# Patient Record
Sex: Male | Born: 2000
Health system: Southern US, Community
[De-identification: ages and names within clinical notes are randomized; demographics above are authoritative.]

---
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
## 2020-02-02 ENCOUNTER — Encounter (HOSPITAL_COMMUNITY): Payer: Self-pay

## 2020-02-02 ENCOUNTER — Encounter (HOSPITAL_COMMUNITY)
Admission: EM | Disposition: A | Discharge status: Home / Self Care | Payer: Self-pay | Source: Home / Self Care | Attending: Emergency Medicine

## 2020-02-02 ENCOUNTER — Other Ambulatory Visit: Payer: Self-pay

## 2020-02-02 ENCOUNTER — Observation Stay (HOSPITAL_COMMUNITY)
Admission: EM | Admit: 2020-02-02 | Discharge: 2020-02-03 | Disposition: A | Discharge status: Home / Self Care | Payer: Self-pay | Attending: Otolaryngology | Admitting: Otolaryngology

## 2020-02-02 ENCOUNTER — Emergency Department (HOSPITAL_COMMUNITY): Payer: Self-pay | Admitting: Certified Registered Nurse Anesthetist

## 2020-02-02 ENCOUNTER — Emergency Department (HOSPITAL_COMMUNITY): Payer: Self-pay

## 2020-02-02 DIAGNOSIS — Z23 Encounter for immunization: Secondary | ICD-10-CM | POA: Insufficient documentation

## 2020-02-02 DIAGNOSIS — Y9301 Activity, walking, marching and hiking: Secondary | ICD-10-CM | POA: Insufficient documentation

## 2020-02-02 DIAGNOSIS — S0266XA Fracture of symphysis of mandible, initial encounter for closed fracture: Principal | ICD-10-CM | POA: Insufficient documentation

## 2020-02-02 DIAGNOSIS — T07XXXA Unspecified multiple injuries, initial encounter: Secondary | ICD-10-CM

## 2020-02-02 DIAGNOSIS — S02609B Fracture of mandible, unspecified, initial encounter for open fracture: Secondary | ICD-10-CM

## 2020-02-02 DIAGNOSIS — Z20822 Contact with and (suspected) exposure to covid-19: Secondary | ICD-10-CM | POA: Insufficient documentation

## 2020-02-02 DIAGNOSIS — S02609A Fracture of mandible, unspecified, initial encounter for closed fracture: Secondary | ICD-10-CM | POA: Diagnosis present

## 2020-02-02 DIAGNOSIS — W1830XA Fall on same level, unspecified, initial encounter: Secondary | ICD-10-CM | POA: Insufficient documentation

## 2020-02-02 DIAGNOSIS — R55 Syncope and collapse: Secondary | ICD-10-CM | POA: Insufficient documentation

## 2020-02-02 DIAGNOSIS — S0181XA Laceration without foreign body of other part of head, initial encounter: Secondary | ICD-10-CM | POA: Insufficient documentation

## 2020-02-02 DIAGNOSIS — Z79899 Other long term (current) drug therapy: Secondary | ICD-10-CM | POA: Insufficient documentation

## 2020-02-02 DIAGNOSIS — S02612A Fracture of condylar process of left mandible, initial encounter for closed fracture: Secondary | ICD-10-CM | POA: Insufficient documentation

## 2020-02-02 HISTORY — PX: ORIF MANDIBULAR FRACTURE: SHX2127

## 2020-02-02 LAB — CBC
HCT: 45 % (ref 39.0–52.0)
Hemoglobin: 15.4 g/dL (ref 13.0–17.0)
MCH: 31.2 pg (ref 26.0–34.0)
MCHC: 34.2 g/dL (ref 30.0–36.0)
MCV: 91.3 fL (ref 80.0–100.0)
Platelets: 286 10*3/uL (ref 150–400)
RBC: 4.93 MIL/uL (ref 4.22–5.81)
RDW: 11.8 % (ref 11.5–15.5)
WBC: 11.8 10*3/uL — ABNORMAL HIGH (ref 4.0–10.5)
nRBC: 0 % (ref 0.0–0.2)

## 2020-02-02 LAB — BASIC METABOLIC PANEL
Anion gap: 11 (ref 5–15)
BUN: 12 mg/dL (ref 6–20)
CO2: 25 mmol/L (ref 22–32)
Calcium: 9.4 mg/dL (ref 8.9–10.3)
Chloride: 103 mmol/L (ref 98–111)
Creatinine, Ser: 1.1 mg/dL (ref 0.61–1.24)
GFR calc Af Amer: >60 mL/min (ref >60)
GFR calc non Af Amer: >60 mL/min (ref >60)
Glucose, Bld: 135 mg/dL — ABNORMAL HIGH (ref 70–99)
Potassium: 3.3 mmol/L — ABNORMAL LOW (ref 3.5–5.1)
Sodium: 139 mmol/L (ref 135–145)

## 2020-02-02 LAB — URINALYSIS, ROUTINE W REFLEX MICROSCOPIC
Bacteria, UA: NONE SEEN
Bilirubin Urine: NEGATIVE
Glucose, UA: NEGATIVE mg/dL
Hgb urine dipstick: NEGATIVE
Ketones, ur: NEGATIVE mg/dL
Leukocytes,Ua: NEGATIVE
Nitrite: NEGATIVE
Protein, ur: 30 mg/dL — AB
Specific Gravity, Urine: 1.027 (ref 1.005–1.030)
pH: 5 (ref 5.0–8.0)

## 2020-02-02 LAB — SARS CORONAVIRUS 2 BY RT PCR (HOSPITAL ORDER, PERFORMED IN ~~LOC~~ HOSPITAL LAB): SARS Coronavirus 2: NEGATIVE

## 2020-02-02 SURGERY — OPEN REDUCTION INTERNAL FIXATION (ORIF) MANDIBULAR FRACTURE
Anesthesia: General

## 2020-02-02 MED ORDER — CEFAZOLIN SODIUM-DEXTROSE 1-4 GM/50ML-% IV SOLN
1.0000 g | Freq: Once | INTRAVENOUS | Status: AC
  Administered 2020-02-02: 1 g via INTRAVENOUS
  Filled 2020-02-02: qty 50

## 2020-02-02 MED ORDER — HYDROCODONE-ACETAMINOPHEN 7.5-325 MG/15ML PO SOLN
10.0000 mL | ORAL | Status: DC | PRN
  Administered 2020-02-03: 10 mL via ORAL
  Filled 2020-02-02: qty 15

## 2020-02-02 MED ORDER — LIDOCAINE-EPINEPHRINE (PF) 2 %-1:200000 IJ SOLN
20.0000 mL | Freq: Once | INTRAMUSCULAR | Status: AC
  Administered 2020-02-02: 20 mL
  Filled 2020-02-02: qty 20

## 2020-02-02 MED ORDER — ROCURONIUM BROMIDE 10 MG/ML (PF) SYRINGE
PREFILLED_SYRINGE | INTRAVENOUS | Status: DC | PRN
  Administered 2020-02-02: 70 mg via INTRAVENOUS

## 2020-02-02 MED ORDER — PROPOFOL 10 MG/ML IV BOLUS
INTRAVENOUS | Status: DC | PRN
  Administered 2020-02-02: 140 mg via INTRAVENOUS

## 2020-02-02 MED ORDER — AMISULPRIDE (ANTIEMETIC) 5 MG/2ML IV SOLN
INTRAVENOUS | Status: AC
  Filled 2020-02-02: qty 4

## 2020-02-02 MED ORDER — LACTATED RINGERS IV SOLN: INTRAVENOUS | Status: DC

## 2020-02-02 MED ORDER — SODIUM CHLORIDE 0.9 % IV BOLUS
1000.0000 mL | Freq: Once | INTRAVENOUS | Status: AC
  Administered 2020-02-02: 1000 mL via INTRAVENOUS

## 2020-02-02 MED ORDER — MIDAZOLAM HCL 5 MG/5ML IJ SOLN
INTRAMUSCULAR | Status: DC | PRN
  Administered 2020-02-02: 2 mg via INTRAVENOUS

## 2020-02-02 MED ORDER — ACETAMINOPHEN 10 MG/ML IV SOLN
INTRAVENOUS | Status: AC
  Filled 2020-02-02: qty 100

## 2020-02-02 MED ORDER — CLINDAMYCIN PHOSPHATE 600 MG/50ML IV SOLN
600.0000 mg | Freq: Once | INTRAVENOUS | Status: AC
  Administered 2020-02-02: 600 mg via INTRAVENOUS
  Filled 2020-02-02: qty 50

## 2020-02-02 MED ORDER — ACETAMINOPHEN 160 MG/5ML PO SOLN: 1000.0000 mg | Freq: Once | ORAL | Status: DC | PRN

## 2020-02-02 MED ORDER — OXYCODONE HCL 5 MG PO TABS: 5.0000 mg | ORAL_TABLET | Freq: Once | ORAL | Status: DC | PRN

## 2020-02-02 MED ORDER — 0.9 % SODIUM CHLORIDE (POUR BTL) OPTIME
TOPICAL | Status: DC | PRN
Start: 2020-02-02 — End: 2020-02-02
  Administered 2020-02-02: 1000 mL

## 2020-02-02 MED ORDER — CLINDAMYCIN PALMITATE HCL 75 MG/5ML PO SOLR
300.0000 mg | Freq: Three times a day (TID) | ORAL | Status: DC
  Administered 2020-02-02 – 2020-02-03 (×2): 300 mg via ORAL
  Filled 2020-02-02 (×4): qty 20

## 2020-02-02 MED ORDER — MIDAZOLAM HCL 2 MG/2ML IJ SOLN
INTRAMUSCULAR | Status: AC
  Filled 2020-02-02: qty 2

## 2020-02-02 MED ORDER — SUGAMMADEX SODIUM 200 MG/2ML IV SOLN
INTRAVENOUS | Status: DC | PRN
  Administered 2020-02-02: 200 mg via INTRAVENOUS

## 2020-02-02 MED ORDER — FENTANYL CITRATE (PF) 100 MCG/2ML IJ SOLN
INTRAMUSCULAR | Status: AC
  Filled 2020-02-02: qty 2

## 2020-02-02 MED ORDER — ACETAMINOPHEN 10 MG/ML IV SOLN
1000.0000 mg | Freq: Once | INTRAVENOUS | Status: DC | PRN
  Administered 2020-02-02: 1000 mg via INTRAVENOUS

## 2020-02-02 MED ORDER — DEXAMETHASONE SODIUM PHOSPHATE 10 MG/ML IJ SOLN
INTRAMUSCULAR | Status: DC | PRN
  Administered 2020-02-02: 10 mg via INTRAVENOUS

## 2020-02-02 MED ORDER — IBUPROFEN 100 MG/5ML PO SUSP: 400.0000 mg | Freq: Four times a day (QID) | ORAL | Status: DC | PRN

## 2020-02-02 MED ORDER — OXYMETAZOLINE HCL 0.05 % NA SOLN
NASAL | Status: AC
  Filled 2020-02-02: qty 30

## 2020-02-02 MED ORDER — AMISULPRIDE (ANTIEMETIC) 5 MG/2ML IV SOLN
10.0000 mg | Freq: Once | INTRAVENOUS | Status: AC
  Administered 2020-02-02: 10 mg via INTRAVENOUS

## 2020-02-02 MED ORDER — FENTANYL CITRATE (PF) 250 MCG/5ML IJ SOLN
INTRAMUSCULAR | Status: DC | PRN
  Administered 2020-02-02 (×2): 50 ug via INTRAVENOUS
  Administered 2020-02-02: 100 ug via INTRAVENOUS
  Administered 2020-02-02: 50 ug via INTRAVENOUS

## 2020-02-02 MED ORDER — FENTANYL CITRATE (PF) 100 MCG/2ML IJ SOLN
25.0000 ug | INTRAMUSCULAR | Status: DC | PRN
  Administered 2020-02-02 (×2): 50 ug via INTRAVENOUS
  Administered 2020-02-02 (×2): 25 ug via INTRAVENOUS
  Administered 2020-02-02: 50 ug via INTRAVENOUS

## 2020-02-02 MED ORDER — OXYMETAZOLINE HCL 0.05 % NA SOLN
2.0000 | NASAL | Status: AC
  Administered 2020-02-02 (×3): 2 via NASAL
  Filled 2020-02-02: qty 30

## 2020-02-02 MED ORDER — SODIUM CHLORIDE 0.9% FLUSH: 3.0000 mL | Freq: Once | INTRAVENOUS | Status: DC

## 2020-02-02 MED ORDER — LIDOCAINE 2% (20 MG/ML) 5 ML SYRINGE
INTRAMUSCULAR | Status: DC | PRN
  Administered 2020-02-02: 60 mg via INTRAVENOUS

## 2020-02-02 MED ORDER — CHLORHEXIDINE GLUCONATE 0.12 % MT SOLN
OROMUCOSAL | Status: AC
  Administered 2020-02-02: 15 mL via OROMUCOSAL
  Filled 2020-02-02: qty 15

## 2020-02-02 MED ORDER — FENTANYL CITRATE (PF) 250 MCG/5ML IJ SOLN
INTRAMUSCULAR | Status: AC
  Filled 2020-02-02: qty 5

## 2020-02-02 MED ORDER — OXYCODONE HCL 5 MG/5ML PO SOLN: 5.0000 mg | Freq: Once | ORAL | Status: DC | PRN

## 2020-02-02 MED ORDER — MORPHINE SULFATE (PF) 4 MG/ML IV SOLN
4.0000 mg | Freq: Once | INTRAVENOUS | Status: AC
  Administered 2020-02-02: 4 mg via INTRAVENOUS
  Filled 2020-02-02: qty 1

## 2020-02-02 MED ORDER — ONDANSETRON HCL 4 MG/2ML IJ SOLN
INTRAMUSCULAR | Status: DC | PRN
  Administered 2020-02-02: 4 mg via INTRAVENOUS

## 2020-02-02 MED ORDER — CHLORHEXIDINE GLUCONATE 0.12 % MT SOLN: 15.0000 mL | Freq: Once | OROMUCOSAL | Status: AC

## 2020-02-02 MED ORDER — DEXTROSE-NACL 5-0.9 % IV SOLN: INTRAVENOUS | Status: DC

## 2020-02-02 MED ORDER — TETANUS-DIPHTH-ACELL PERTUSSIS 5-2.5-18.5 LF-MCG/0.5 IM SUSP
0.5000 mL | Freq: Once | INTRAMUSCULAR | Status: AC
  Administered 2020-02-02: 0.5 mL via INTRAMUSCULAR
  Filled 2020-02-02: qty 0.5

## 2020-02-02 MED ORDER — ASHWAGANDHA 500 MG PO CAPS: 500.0000 mg | ORAL_CAPSULE | Freq: Every day | ORAL | Status: DC

## 2020-02-02 MED ORDER — PROMETHAZINE HCL 25 MG PO TABS: 25.0000 mg | ORAL_TABLET | Freq: Four times a day (QID) | ORAL | Status: DC | PRN

## 2020-02-02 MED ORDER — ACETAMINOPHEN 500 MG PO TABS: 1000.0000 mg | ORAL_TABLET | Freq: Once | ORAL | Status: DC | PRN

## 2020-02-02 MED ORDER — PROMETHAZINE HCL 25 MG RE SUPP
25.0000 mg | Freq: Four times a day (QID) | RECTAL | Status: DC | PRN
  Filled 2020-02-02: qty 1

## 2020-02-02 MED ORDER — LIDOCAINE-EPINEPHRINE 1 %-1:100000 IJ SOLN
INTRAMUSCULAR | Status: AC
  Filled 2020-02-02: qty 1

## 2020-02-02 MED ORDER — DOUBLE ANTIBIOTIC 500-10000 UNIT/GM EX OINT
TOPICAL_OINTMENT | CUTANEOUS | Status: AC
  Filled 2020-02-02: qty 28.4

## 2020-02-02 SURGICAL SUPPLY — 37 items
BAR ARCH PREFORM MXLMNDB FX (Miscellaneous) ×2 IMPLANT
BAR FIX PREFORMED OMNIMAX (Miscellaneous) ×4 IMPLANT
BLADE SURG 15 STRL LF DISP TIS (BLADE) IMPLANT
BLADE SURG 15 STRL SS (BLADE)
CANISTER SUCT 3000ML PPV (MISCELLANEOUS) ×3 IMPLANT
CLEANER TIP ELECTROSURG 2X2 (MISCELLANEOUS) ×3 IMPLANT
COVER SURGICAL LIGHT HANDLE (MISCELLANEOUS) ×3 IMPLANT
COVER WAND RF STERILE (DRAPES) IMPLANT
DRAPE HALF SHEET 40X57 (DRAPES) IMPLANT
ELECT COATED BLADE 2.86 ST (ELECTRODE) ×3 IMPLANT
ELECT NDL TIP 2.8 STRL (NEEDLE) IMPLANT
ELECT NEEDLE TIP 2.8 STRL (NEEDLE) IMPLANT
ELECT REM PT RETURN 9FT ADLT (ELECTROSURGICAL) ×3
ELECTRODE REM PT RTRN 9FT ADLT (ELECTROSURGICAL) ×1 IMPLANT
GLOVE ECLIPSE 7.5 STRL STRAW (GLOVE) ×3 IMPLANT
GOWN STRL REUS W/ TWL LRG LVL3 (GOWN DISPOSABLE) ×2 IMPLANT
GOWN STRL REUS W/TWL LRG LVL3 (GOWN DISPOSABLE) ×6
KIT BASIN OR (CUSTOM PROCEDURE TRAY) ×3 IMPLANT
KIT TURNOVER KIT B (KITS) ×3 IMPLANT
NDL PRECISIONGLIDE 27X1.5 (NEEDLE) ×1 IMPLANT
NEEDLE PRECISIONGLIDE 27X1.5 (NEEDLE) ×3 IMPLANT
NS IRRIG 1000ML POUR BTL (IV SOLUTION) ×3 IMPLANT
PAD ARMBOARD 7.5X6 YLW CONV (MISCELLANEOUS) ×6 IMPLANT
PENCIL FOOT CONTROL (ELECTRODE) ×3 IMPLANT
SCISSORS WIRE ANG 4 3/4 DISP (INSTRUMENTS) ×3 IMPLANT
SCREW BONE 2X7 CROSS DRIVE (Screw) ×6 IMPLANT
SCREW BONE MANDIB SD 2X9 (Screw) ×6 IMPLANT
SUT CHROMIC 3 0 PS 2 (SUTURE) ×3 IMPLANT
SUT STEEL 0 (SUTURE)
SUT STEEL 0 18XMFL TIE 17 (SUTURE) IMPLANT
SUT STEEL 2 (SUTURE) IMPLANT
SUT STEEL 4 (SUTURE) IMPLANT
SUT VICRYL 4-0 PS2 18IN ABS (SUTURE) ×3 IMPLANT
TOWEL GREEN STERILE FF (TOWEL DISPOSABLE) ×3 IMPLANT
TRAY ENT MC OR (CUSTOM PROCEDURE TRAY) ×3 IMPLANT
WATER STERILE IRR 1000ML POUR (IV SOLUTION) ×3 IMPLANT
WIRE 24 GAUGE OMINIMAX MMF (WIRE) ×2 IMPLANT

## 2020-02-02 NOTE — ED Notes (Signed)
Mother has pt belongings. She is waiting in surgical waiting area.

## 2020-02-02 NOTE — ED Notes (Signed)
Suture cart at bedside 

## 2020-02-02 NOTE — Anesthesia Preprocedure Evaluation (Addendum)
Anesthesia Evaluation  Patient identified by MRN, date of birth, ID band Patient awake    Reviewed: Allergy & Precautions, NPO status , Patient's Chart, lab work & pertinent test results  History of Anesthesia Complications Negative for: history of anesthetic complications  Airway Mallampati: IV  TM Distance: >3 FB Neck ROM: Full  Mouth opening: Limited Mouth Opening  Dental  (+) Dental Advisory Given,    Pulmonary Recent URI ,    breath sounds clear to auscultation       Cardiovascular negative cardio ROS   Rhythm:Regular     Neuro/Psych negative neurological ROS  negative psych ROS   GI/Hepatic negative GI ROS, Neg liver ROS,   Endo/Other  negative endocrine ROS  Renal/GU negative Renal ROS     Musculoskeletal negative musculoskeletal ROS (+)   Abdominal   Peds  Hematology negative hematology ROS (+)   Anesthesia Other Findings   Reproductive/Obstetrics                            Anesthesia Physical Anesthesia Plan  ASA: I  Anesthesia Plan: General   Post-op Pain Management:    Induction: Intravenous  PONV Risk Score and Plan: 2 and Treatment may vary due to age or medical condition, Ondansetron and Dexamethasone  Airway Management Planned: Nasal ETT  Additional Equipment: None  Intra-op Plan:   Post-operative Plan: Extubation in OR  Informed Consent: I have reviewed the patients History and Physical, chart, labs and discussed the procedure including the risks, benefits and alternatives for the proposed anesthesia with the patient or authorized representative who has indicated his/her understanding and acceptance.     Dental advisory given  Plan Discussed with: CRNA and Surgeon  Anesthesia Plan Comments:         Anesthesia Quick Evaluation

## 2020-02-02 NOTE — ED Provider Notes (Signed)
[facility] EMERGENCY DEPARTMENT Provider Note   CSN: [id number] Arrival date & time: 02/02/20  0446     History Chief Complaint  Patient presents with  . Loss of Consciousness    Shahzaib Azevedo is a 19 y.o. male.  The history is provided by the patient. No language interpreter was used.  Loss of Consciousness    19 year old male without significant past medical history presenting for evaluation of syncope.  Patient states he works third shift at [employer].  He has been working there for approximately 1 year.  This morning after he got up from a shift, drove home, and while in the process of getting out of his car he report feeling lightheadedness and subsequently had a syncopal episode.  He fell forward striking the ground with his face and arm.  He believes he may have passed out for a few seconds.  He denies any confusion afterward.  He does complain of pain to his face and chin, along with pain to his chest and hands from the impact.  He denies any tongue biting, urinary or bowel incontinence.  He denies any focal numbness weakness denies any significant neck pain.  Denies any vertiginous symptoms.  No history of seizure.  He did not think it was hot at his workplace.  He denies missing any meal.  He does not have any known history of cardiac disease.  No prior history of PE or DVT.  No complaints of shortness of breath.  History reviewed. No pertinent past medical history.  There are no problems to display for this patient.   History reviewed. No pertinent surgical history.     No family history on file.  Social History   Tobacco Use  . Smoking status: Never Smoker  . Smokeless tobacco: Never Used  Substance Use Topics  . Alcohol use: Never  . Drug use: Never    Home Medications Prior to Admission medications   Medication Sig Start Date End Date Taking? Authorizing Provider  lansoprazole (PREVACID) 15 MG capsule Take 1 capsule (15 mg  total) by mouth daily. 10/27/12   Niel Hummer, MD    Allergies    Patient has no known allergies.  Review of Systems   Review of Systems  Cardiovascular: Positive for syncope.  All other systems reviewed and are negative.   Physical Exam Updated Vital Signs BP 122/64 (BP Location: Left Arm)   Pulse 81   Temp 98.7 F (37.1 C) (Oral)   Resp 16   Ht 5\' 3"  (1.6 m)   Wt 55.8 kg   SpO2 100%   BMI 21.79 kg/m   Physical Exam Vitals and nursing note reviewed.  Constitutional:      General: He is not in acute distress.    Appearance: He is well-developed.  HENT:     Head:     Comments: Abrasion noted to right cheek.  3 cm laceration noted to the inferior chin on the right side with tenderness to palpation.  No malocclusion noted.  No hemotympanum, no septal hematoma, no significant midface tenderness. Eyes:     Extraocular Movements: Extraocular movements intact.     Conjunctiva/sclera: Conjunctivae normal.     Pupils: Pupils are equal, round, and reactive to light.  Neck:     Comments: No cervical midline spine tenderness. Cardiovascular:     Rate and Rhythm: Normal rate and regular rhythm.     Pulses: Normal pulses.     Heart sounds: Normal heart sounds.  Pulmonary:     Effort: Pulmonary effort is normal.     Breath sounds: Normal breath sounds.  Chest:     Chest wall: Tenderness (Mild tenderness to right anterior chest wall without any crepitus or emphysema.  No overlying skin changes.) present.  Abdominal:     General: Abdomen is flat.     Palpations: Abdomen is soft.  Musculoskeletal:     Cervical back: Normal range of motion and neck supple.  Skin:    Findings: No rash.     Comments: Abrasion noted to the dorsum of hands bilaterally with normal movement about all fingers.  Neurological:     Mental Status: He is alert and oriented to person, place, and time.     GCS: GCS eye subscore is 4. GCS verbal subscore is 5. GCS motor subscore is 6.     Cranial Nerves:  Cranial nerves are intact.     Sensory: Sensation is intact.     Motor: Motor function is intact.     Gait: Gait is intact.     ED Results / Procedures / Treatments   Labs (all labs ordered are listed, but only abnormal results are displayed) Labs Reviewed  BASIC METABOLIC PANEL - Abnormal; Notable for the following components:      Result Value   Potassium 3.3 (*)    Glucose, Bld 135 (*)    All other components within normal limits  CBC - Abnormal; Notable for the following components:   WBC 11.8 (*)    All other components within normal limits  URINALYSIS, ROUTINE W REFLEX MICROSCOPIC - Abnormal; Notable for the following components:   Protein, ur 30 (*)    All other components within normal limits  SARS CORONAVIRUS 2 BY RT PCR (HOSPITAL ORDER, PERFORMED IN [facility])  CBG MONITORING, ED    EKG EKG Interpretation  Date/Time:  Tuesday February 02 2020 05:11:17 EDT Ventricular Rate:  90 PR Interval:  146 QRS Duration: 88 QT Interval:  356 QTC Calculation: 435 R Axis:   93 Text Interpretation: Normal sinus rhythm Right atrial enlargement Rightward axis ST elevation, consider early repolarization Borderline ECG Confirmed by Virgina Norfolk [phone]) on 02/02/2020 1:00:36 PM   ED ECG REPORT   Date: 02/02/2020  Rate: 90  Rhythm: normal sinus rhythm  QRS Axis: right  Intervals: normal  ST/T Wave abnormalities: early repolarization  Conduction Disutrbances:none  Narrative Interpretation:   Old EKG Reviewed: none available  I have personally reviewed the EKG tracing and agree with the computerized printout as noted.   Radiology CT Head Wo Contrast  Result Date: 02/02/2020 CLINICAL DATA:  Recent fall with facial pain and headaches, initial encounter EXAM: CT HEAD WITHOUT CONTRAST CT MAXILLOFACIAL WITHOUT CONTRAST TECHNIQUE: Multidetector CT imaging of the head and maxillofacial structures were performed using the standard protocol without intravenous contrast.  Multiplanar CT image reconstructions of the maxillofacial structures were also generated. COMPARISON:  None. FINDINGS: CT HEAD FINDINGS Brain: No evidence of acute infarction, hemorrhage, hydrocephalus, extra-axial collection or mass lesion/mass effect. Vascular: No hyperdense vessel or unexpected calcification. Skull: Normal. Negative for fracture or focal lesion. Other: Note is made of a fracture through the left mandibular neck extending towards the condyle. CT MAXILLOFACIAL FINDINGS Osseous: Multiple mandibular fractures are identified. A fracture through the left mandibular neck extending towards the condyle with mild impaction is noted. Additionally a midline fracture of the mandible anteriorly is seen extending obliquely through the right mandible. Only minimal displacement is noted in  the more anterior mandibular fracture. No other fractures are noted. Nasal bones are intact. Orbits: Orbits and their contents are within normal limits. Sinuses: Paranasal sinuses well aerated. Mucosal changes are seen. No air-fluid levels noted. Soft tissues: Surrounding soft tissue structures are within normal limits. No focal hematoma is seen. IMPRESSION: CT of the head: No acute intracranial abnormality noted. CT of the maxillofacial bones: Oblique fracture through the anterior aspect of the mandible extending to the right as well as an oblique fracture through the mandibular neck on the left with impaction. Electronically Signed   By: Alcide Clever M.D.   On: 02/02/2020 13:57   CT Maxillofacial Wo Contrast  Result Date: 02/02/2020 CLINICAL DATA:  Recent fall with facial pain and headaches, initial encounter EXAM: CT HEAD WITHOUT CONTRAST CT MAXILLOFACIAL WITHOUT CONTRAST TECHNIQUE: Multidetector CT imaging of the head and maxillofacial structures were performed using the standard protocol without intravenous contrast. Multiplanar CT image reconstructions of the maxillofacial structures were also generated. COMPARISON:   None. FINDINGS: CT HEAD FINDINGS Brain: No evidence of acute infarction, hemorrhage, hydrocephalus, extra-axial collection or mass lesion/mass effect. Vascular: No hyperdense vessel or unexpected calcification. Skull: Normal. Negative for fracture or focal lesion. Other: Note is made of a fracture through the left mandibular neck extending towards the condyle. CT MAXILLOFACIAL FINDINGS Osseous: Multiple mandibular fractures are identified. A fracture through the left mandibular neck extending towards the condyle with mild impaction is noted. Additionally a midline fracture of the mandible anteriorly is seen extending obliquely through the right mandible. Only minimal displacement is noted in the more anterior mandibular fracture. No other fractures are noted. Nasal bones are intact. Orbits: Orbits and their contents are within normal limits. Sinuses: Paranasal sinuses well aerated. Mucosal changes are seen. No air-fluid levels noted. Soft tissues: Surrounding soft tissue structures are within normal limits. No focal hematoma is seen. IMPRESSION: CT of the head: No acute intracranial abnormality noted. CT of the maxillofacial bones: Oblique fracture through the anterior aspect of the mandible extending to the right as well as an oblique fracture through the mandibular neck on the left with impaction. Electronically Signed   By: Alcide Clever M.D.   On: 02/02/2020 13:57    Procedures .Marland KitchenLaceration Repair  Date/Time: 02/02/2020 1:32 PM Performed by: Fayrene Helper, PA-C Authorized by: Fayrene Helper, PA-C   Consent:    Consent obtained:  Verbal   Consent given by:  Patient   Risks discussed:  Infection, need for additional repair, pain, poor cosmetic result and poor wound healing   Alternatives discussed:  No treatment and delayed treatment Universal protocol:    Procedure explained and questions answered to patient or proxy's satisfaction: yes     Relevant documents present and verified: yes     Test results  available and properly labeled: yes     Imaging studies available: yes     Required blood products, implants, devices, and special equipment available: yes     Site/side marked: yes     Immediately prior to procedure, a time out was called: yes     Patient identity confirmed:  Verbally with patient Anesthesia (see MAR for exact dosages):    Anesthesia method:  Local infiltration Laceration details:    Location:  Face   Face location:  Chin   Length (cm):  3   Depth (mm):  3 Repair type:    Repair type:  Simple Pre-procedure details:    Preparation:  Patient was prepped and draped in usual sterile fashion  Exploration:    Hemostasis achieved with:  Direct pressure   Wound exploration: wound explored through full range of motion and entire depth of wound probed and visualized     Wound extent: no foreign bodies/material noted and no underlying fracture noted     Contaminated: no   Treatment:    Area cleansed with:  Betadine and saline   Amount of cleaning:  Standard   Irrigation solution:  Sterile saline Skin repair:    Repair method:  Sutures   Suture size:  5-0   Suture material:  Prolene   Suture technique:  Simple interrupted   Number of sutures:  3 Approximation:    Approximation:  Close Post-procedure details:    Dressing:  Non-adherent dressing   Patient tolerance of procedure:  Tolerated well, no immediate complications   (including critical care time)  Medications Ordered in ED Medications  sodium chloride flush (NS) 0.9 % injection 3 mL (has no administration in time range)  ceFAZolin (ANCEF) IVPB 1 g/50 mL premix (has no administration in time range)  Tdap (BOOSTRIX) injection 0.5 mL (0.5 mLs Intramuscular Given 02/02/20 1115)  lidocaine-EPINEPHrine (XYLOCAINE W/EPI) 2 %-1:200000 (PF) injection 20 mL (20 mLs Infiltration Given 02/02/20 1116)  sodium chloride 0.9 % bolus 1,000 mL (0 mLs Intravenous Stopped 02/02/20 1156)  morphine 4 MG/ML injection 4 mg (4 mg  Intravenous Given 02/02/20 1116)    ED Course  I have reviewed the triage vital signs and the nursing notes.  Pertinent labs & imaging results that were available during my care of the patient were reviewed by me and considered in my medical decision making (see chart for details).    The cardiac monitor revealed normal sinus rhythm as interpreted by me. The cardiac monitor was ordered secondary to the patient's history of syncope and to monitor the patient for dysrhythmia  MDM Rules/Calculators/A&P                          BP 122/64 (BP Location: Left Arm)   Pulse 81   Temp 98.7 F (37.1 C) (Oral)   Resp 16   Ht  (1.6 m)   Wt 55.8 kg   SpO2 100%   BMI 21.79 kg/m   Final Clinical Impression(s) / ED Diagnoses Final diagnoses:  Syncope and collapse  Jaw fracture, open, initial encounter (HCC)  Chin laceration, initial encounter  Abrasions of multiple sites    Rx / DC Orders ED Discharge Orders    None     10:57 AM Patient presents for evaluation of a syncopal event that happened when he got out from his car after a night shift.  He did endorse some lightheadedness prior to his syncope.  He has abrasion as well as laceration to his face at his cheek, abrasion also noted to dorsum of hands.  He is mentating appropriately, no significant cervical midline spine tenderness.  Will obtain head and lateral facial CT scan and will provide laceration repair.  Will check orthostatic vital sign, and give IV fluid.  Patient is EKG did show some ST elevation, consistent with early repolarization.  No concerning arrhythmia. No obvious ECG changes to suggest HOCM.   1:33 PM Chin laceration repaired by me.  Sutures will need to be removed in 5 days.  2:13 PM Head CT and maxillofacial CT obtained demonstrate oblique fracture through the anterior aspects of the mandible extending to the right as well as an oblique fracture through  the mandible neck on the left with impaction.  This is  considered to be an unstable jaw fracture.  Will consult maxillofacial/ENT specialist for recommendation.  2:50 PM Appreciate consultation from ENT specialist Dr. Pollyann Kennedyosen who request pt to be NPO and he will evaluate pt in the ER and likely OR for wire jaw. Since pt has laceration to chin and has jaw fracture, will initiate abx Ancef as potential open fx.  covid-19 screening test ordered.    Jyl Heinzdrian Trinidad-Patricio was evaluated in Emergency Department on 02/02/2020 for the symptoms described in the history of present illness. He was evaluated in the context of the global COVID-19 pandemic, which necessitated consideration that the patient might be at risk for infection with the SARS-CoV-2 virus that causes COVID-19. Institutional protocols and algorithms that pertain to the evaluation of patients at risk for COVID-19 are in a state of rapid change based on information released by regulatory bodies including the CDC and federal and state organizations. These policies and algorithms were followed during the patient's care in the ED.    Fayrene Helperran, Ophie Burrowes, PA-C 02/02/20 1453    Virgina Norfolkuratolo, Adam, DO 02/02/20 [phone]

## 2020-02-02 NOTE — ED Notes (Signed)
PA at bedside.

## 2020-02-02 NOTE — H&P (Signed)
David Ball is an 19 y.o. male.   Chief Complaint: mandible fracture HPI: Early this morning, he got lightheaded, fainted and fell on his chin, Evaluation in the ED revealed bialteral mandible fracture. He complains of pain in the right side anterior jaw and the left pre-auricular region. He complains that his teeth are not aligned properly. Otherwise in good health. Not a smoker or drinker.  History reviewed. No pertinent past medical history.  History reviewed. No pertinent surgical history.  No family history on file. Social History:  reports that he has never smoked. He has never used smokeless tobacco. He reports that he does not drink alcohol and does not use drugs.  Allergies: No Known Allergies  (Not in a hospital admission)   Results for orders placed or performed during the hospital encounter of 02/02/20 (from the past 48 hour(s))  Urinalysis, Routine w reflex microscopic     Status: Abnormal   Collection Time: 02/02/20  4:54 AM  Result Value Ref Range   Color, Urine YELLOW YELLOW   APPearance CLEAR CLEAR   Specific Gravity, Urine 1.027 1.005 - 1.030   pH 5.0 5.0 - 8.0   Glucose, UA NEGATIVE NEGATIVE mg/dL   Hgb urine dipstick NEGATIVE NEGATIVE   Bilirubin Urine NEGATIVE NEGATIVE   Ketones, ur NEGATIVE NEGATIVE mg/dL   Protein, ur 30 (A) NEGATIVE mg/dL   Nitrite NEGATIVE NEGATIVE   Leukocytes,Ua NEGATIVE NEGATIVE   RBC / HPF 0-5 0 - 5 RBC/hpf   WBC, UA 0-5 0 - 5 WBC/hpf   Bacteria, UA NONE SEEN NONE SEEN   Mucus PRESENT     Comment: Performed at [facility], [address] [place], [place] [address]  Basic metabolic panel     Status: Abnormal   Collection Time: 02/02/20  5:00 AM  Result Value Ref Range   Sodium 139 135 - 145 mmol/L   Potassium 3.3 (L) 3.5 - 5.1 mmol/L   Chloride 103 98 - 111 mmol/L   CO2 25 22 - 32 mmol/L   Glucose, Bld 135 (H) 70 - 99 mg/dL    Comment: Glucose reference range applies only to samples taken after fasting for  at least 8 hours.   BUN 12 6 - 20 mg/dL   Creatinine, Ser 9.50 0.61 - 1.24 mg/dL   Calcium 9.4 8.9 - 93.2 mg/dL   GFR calc non Af Amer >60 >60 mL/min   GFR calc Af Amer >60 >60 mL/min   Anion gap 11 5 - 15    Comment: Performed at [facility], [address] [place], [place] [address]  CBC     Status: Abnormal   Collection Time: 02/02/20  5:00 AM  Result Value Ref Range   WBC 11.8 (H) 4.0 - 10.5 K/uL   RBC 4.93 4.22 - 5.81 MIL/uL   Hemoglobin 15.4 13.0 - 17.0 g/dL   HCT 58.0 39 - 52 %   MCV 91.3 80.0 - 100.0 fL   MCH 31.2 26.0 - 34.0 pg   MCHC 34.2 30.0 - 36.0 g/dL   RDW 99.8 33.8 - 25.0 %   Platelets 286 150 - 400 K/uL   nRBC 0.0 0.0 - 0.2 %    Comment: Performed at [facility], [address] [place], [place] [address]   CT Head Wo Contrast  Result Date: 02/02/2020 CLINICAL DATA:  Recent fall with facial pain and headaches, initial encounter EXAM: CT HEAD WITHOUT CONTRAST CT MAXILLOFACIAL WITHOUT CONTRAST TECHNIQUE: Multidetector CT  imaging of the head and maxillofacial structures were performed using the standard protocol without intravenous contrast. Multiplanar CT image reconstructions of the maxillofacial structures were also generated. COMPARISON:  None. FINDINGS: CT HEAD FINDINGS Brain: No evidence of acute infarction, hemorrhage, hydrocephalus, extra-axial collection or mass lesion/mass effect. Vascular: No hyperdense vessel or unexpected calcification. Skull: Normal. Negative for fracture or focal lesion. Other: Note is made of a fracture through the left mandibular neck extending towards the condyle. CT MAXILLOFACIAL FINDINGS Osseous: Multiple mandibular fractures are identified. A fracture through the left mandibular neck extending towards the condyle with mild impaction is noted. Additionally a midline fracture of the mandible anteriorly is seen extending obliquely through the right mandible. Only minimal displacement is noted in the more anterior mandibular  fracture. No other fractures are noted. Nasal bones are intact. Orbits: Orbits and their contents are within normal limits. Sinuses: Paranasal sinuses well aerated. Mucosal changes are seen. No air-fluid levels noted. Soft tissues: Surrounding soft tissue structures are within normal limits. No focal hematoma is seen. IMPRESSION: CT of the head: No acute intracranial abnormality noted. CT of the maxillofacial bones: Oblique fracture through the anterior aspect of the mandible extending to the right as well as an oblique fracture through the mandibular neck on the left with impaction. Electronically Signed   By: Alcide Clever M.D.   On: 02/02/2020 13:57   CT Maxillofacial Wo Contrast  Result Date: 02/02/2020 CLINICAL DATA:  Recent fall with facial pain and headaches, initial encounter EXAM: CT HEAD WITHOUT CONTRAST CT MAXILLOFACIAL WITHOUT CONTRAST TECHNIQUE: Multidetector CT imaging of the head and maxillofacial structures were performed using the standard protocol without intravenous contrast. Multiplanar CT image reconstructions of the maxillofacial structures were also generated. COMPARISON:  None. FINDINGS: CT HEAD FINDINGS Brain: No evidence of acute infarction, hemorrhage, hydrocephalus, extra-axial collection or mass lesion/mass effect. Vascular: No hyperdense vessel or unexpected calcification. Skull: Normal. Negative for fracture or focal lesion. Other: Note is made of a fracture through the left mandibular neck extending towards the condyle. CT MAXILLOFACIAL FINDINGS Osseous: Multiple mandibular fractures are identified. A fracture through the left mandibular neck extending towards the condyle with mild impaction is noted. Additionally a midline fracture of the mandible anteriorly is seen extending obliquely through the right mandible. Only minimal displacement is noted in the more anterior mandibular fracture. No other fractures are noted. Nasal bones are intact. Orbits: Orbits and their contents are  within normal limits. Sinuses: Paranasal sinuses well aerated. Mucosal changes are seen. No air-fluid levels noted. Soft tissues: Surrounding soft tissue structures are within normal limits. No focal hematoma is seen. IMPRESSION: CT of the head: No acute intracranial abnormality noted. CT of the maxillofacial bones: Oblique fracture through the anterior aspect of the mandible extending to the right as well as an oblique fracture through the mandibular neck on the left with impaction. Electronically Signed   By: Alcide Clever M.D.   On: 02/02/2020 13:57    ROS: otherwise negative  Blood pressure 120/68, pulse 80, temperature 98.7 F (37.1 C), temperature source Oral, resp. rate 16, height 5\' 3"  (1.6 m), weight 55.8 kg, SpO2 100 %.  PHYSICAL EXAM: Overall appearance:  Healthy appearing, in no distress Head:  Normocephalic, atraumatic. Ears: External ears are healthy. Nose: External nose is healthy in appearance. Internal nasal exam free of any lesions or obstruction. Oral Cavity/pharynx:  There are no mucosal lesions or masses identified. He is tender along the right anterior mandible. He has a mild open bite deformity  anteriorly. Hypopharynx/Larynx: no signs of any mucosal lesions or masses identified. Vocal cords move normally. Neuro:  No identifiable neurologic deficits. No hypesthesia in the mandibular nerve distribution. Neck: No palpable neck masses.  Studies Reviewed: maxillo-facial CT    Assessment/Plan Right anterior parasymphiseal non-displaced fracture, and left condylar displaced fracture with mal-occlusion. Recommend maxillo-mandibular fixation with arch bars later today.He will be admitted to hospital now and will likely go home tomorrow. He understands and agrees and all questions were answered.  Serena Colonel 02/02/2020, 4:12 PM

## 2020-02-02 NOTE — Anesthesia Procedure Notes (Signed)
Procedure Name: Intubation Date/Time: 02/02/2020 6:49 PM Performed by: Adonis Housekeeper, CRNA Pre-anesthesia Checklist: Patient identified, Emergency Drugs available, Suction available and Patient being monitored Patient Re-evaluated:Patient Re-evaluated prior to induction Oxygen Delivery Method: Circle system utilized Preoxygenation: Pre-oxygenation with 100% oxygen Induction Type: IV induction Ventilation: Mask ventilation without difficulty Laryngoscope Size: Glidescope and 4 Grade View: Grade I Nasal Tubes: Right, Nasal prep performed and Nasal Rae Tube size: 7.0 mm Number of attempts: 2 Airway Equipment and Method: Video-laryngoscopy Placement Confirmation: ETT inserted through vocal cords under direct vision,  positive ETCO2 and breath sounds checked- equal and bilateral Tube secured with: Tape Dental Injury: Teeth and Oropharynx as per pre-operative assessment

## 2020-02-02 NOTE — Transfer of Care (Signed)
Immediate Anesthesia Transfer of Care Note  Patient: David Ball  Procedure(s) Performed: MMS FIXATION WITH HYBIRD ARCH BARS (N/A )  Patient Location: PACU  Anesthesia Type:General  Level of Consciousness: awake, alert , oriented and patient cooperative  Airway & Oxygen Therapy: Patient Spontanous Breathing  Post-op Assessment: Report given to RN and Post -op Vital signs reviewed and stable  Post vital signs: Reviewed and stable  Last Vitals:  Vitals Value Taken Time  BP 154/88 02/02/20 1951  Temp 36.5 C 02/02/20 1950  Pulse 87 02/02/20 1954  Resp 9 02/02/20 1954  SpO2 100 % 02/02/20 1954  Vitals shown include unvalidated device data.  Last Pain:  Vitals:   02/02/20 1950  TempSrc:   PainSc: 0-No pain         Complications: No complications documented.

## 2020-02-02 NOTE — Op Note (Signed)
OPERATIVE REPORT  DATE OF SURGERY: 02/02/2020  PATIENT:  David Ball,  19 y.o. male  PRE-OPERATIVE DIAGNOSIS:  mandible fracture  POST-OPERATIVE DIAGNOSIS:  mandible fracture  PROCEDURE:  Procedure(s): MMS FIXATION WITH HYBIRD ARCH BARS  SURGEON:  Susy Frizzle, MD  ASSISTANTS: None  ANESTHESIA:   General   EBL: 10 ml  DRAINS: None  LOCAL MEDICATIONS USED:  None  SPECIMEN:  none  COUNTS:  Correct  PROCEDURE DETAILS: The patient was taken to the operating room and placed on the operating table in the supine position. Following induction of general endotracheal anesthesia, using nasal intubation, the mouth was prepped and draped in a standard fashion.  A cheek retractor was used throughout the case.  The occlusion was established manually and Omni max hybrid arch bars were placed in the maxilla and the mandible.  Each with 3 screws, 7 mm in the maxilla and 9 mm in the mandible.  There was good stable attachment of the arch bars.  24-gauge wires were used one on each side laterally around the molars and one anteriorly encompassing all of the incisors.  Occlusion was established and MMF was stable.  The oral cavity was suctioned and rinsed with saline.  Patient was awakened extubated and transferred to recovery in stable condition.    PATIENT DISPOSITION:  To PACU, stable

## 2020-02-02 NOTE — ED Triage Notes (Signed)
Pt states that he was walking into the house and had a syncopal episode, pt has several abrasions to the face and 1/2 laceration to the chin, bleeding controlled, last tetanus unknown. C/o of jaw pain

## 2020-02-03 ENCOUNTER — Encounter (HOSPITAL_COMMUNITY): Payer: Self-pay | Admitting: Otolaryngology

## 2020-02-03 MED ORDER — HYDROCODONE-ACETAMINOPHEN 7.5-325 MG/15ML PO SOLN: 15.0000 mL | Freq: Four times a day (QID) | ORAL | 0 refills | Status: DC | PRN

## 2020-02-03 MED ORDER — PROMETHAZINE HCL 25 MG RE SUPP
25.0000 mg | Freq: Four times a day (QID) | RECTAL | 1 refills | Status: DC | PRN
Start: 2020-02-03 — End: 2020-04-01

## 2020-02-03 MED ORDER — CLINDAMYCIN HCL 300 MG PO CAPS
300.0000 mg | ORAL_CAPSULE | Freq: Three times a day (TID) | ORAL | 0 refills | Status: DC
Start: 2020-02-03 — End: 2020-04-01

## 2020-02-03 MED FILL — PROMETHAZINE HCL 25 MG SUPP: 25 | 3 days supply | Qty: 12 | Fill #0

## 2020-02-03 MED FILL — HYDROCOD-APAP 7.5-325/15ML: 7.5-325 | 4 days supply | Qty: 376 | Fill #0

## 2020-02-03 MED FILL — CLINDAMYCIN HCL 300 MG CAPS: 300 | 5 days supply | Qty: 15 | Fill #0

## 2020-02-03 NOTE — Plan of Care (Signed)
  Problem: Education: Goal: Knowledge of General Education information will improve Description: Including pain rating scale, medication(s)/side effects and non-pharmacologic comfort measures Outcome: Progressing   Problem: Health Behavior/Discharge Planning: Goal: Ability to manage health-related needs will improve Outcome: Progressing   Problem: Clinical Measurements: Goal: Ability to maintain clinical measurements within normal limits will improve Outcome: Progressing Goal: Will remain free from infection Outcome: Progressing Goal: Diagnostic test results will improve Outcome: Progressing Goal: Respiratory complications will improve Outcome: Progressing   Problem: Activity: Goal: Risk for activity intolerance will decrease Outcome: Progressing   Problem: Nutrition: Goal: Adequate nutrition will be maintained Outcome: Progressing   Problem: Pain Managment: Goal: General experience of comfort will improve Outcome: Progressing   Problem: Safety: Goal: Ability to remain free from injury will improve Outcome: Progressing   Problem: Clinical Measurements: Goal: Postoperative complications will be avoided or minimized Outcome: Progressing

## 2020-02-03 NOTE — Discharge Summary (Signed)
Physician Discharge Summary  Patient ID: David Ball MRN: [id number] DOB/AGE: [birth year] 19 y.o.  Admit date: 02/02/2020 Discharge date: 02/03/2020  Admission Diagnoses: Mandible fracture  Discharge Diagnoses:  Active Problems:   Mandible fracture [facility])   Discharged Condition: good  Hospital Course: No complications  Consults: none  Significant Diagnostic Studies: none  Treatments: surgery: Maxillomandibular fixation with arch bars  Discharge Exam: Blood pressure 117/81, pulse 71, temperature 97.8 F (36.6 C), temperature source Oral, resp. rate 18, height 5\' 3"  (1.6 m), weight 55.8 kg, SpO2 100 %. PHYSICAL EXAM: Awake and alert, breathing clearly.  Voice is normal.  MMF is intact with good stability.  No swelling.  Disposition: Discharge disposition: 01-Home or Self Care       Discharge Instructions    Diet full liquid   Complete by: As directed    Increase activity slowly   Complete by: As directed      Allergies as of 02/03/2020   No Known Allergies     Medication List    TAKE these medications   Ashwagandha 500 MG Caps Take 500 mg by mouth daily.   clindamycin 300 MG capsule Commonly known as: Cleocin Take 1 capsule (300 mg total) by mouth 3 (three) times daily.   HYDROcodone-acetaminophen 7.5-325 mg/15 ml solution Commonly known as: HYCET Take 15 mLs by mouth 4 (four) times daily as needed for moderate pain.   lansoprazole 15 MG capsule Commonly known as: Prevacid Take 1 capsule (15 mg total) by mouth daily.   promethazine 25 MG suppository Commonly known as: PHENERGAN Place 1 suppository (25 mg total) rectally every 6 (six) hours as needed for nausea or vomiting.       Follow-up Information    12-04-1978, MD. Schedule an appointment as soon as possible for a visit in 2 week(s).   Specialty: Otolaryngology Contact information: [address] [place] [phone]                Signed: [phone] 02/03/2020, 10:49 AM

## 2020-02-03 NOTE — Progress Notes (Signed)
Discharged patient to home, AVS given and explain with Mother at bedside. Medication received via [facility] pharmacy. Belongings returned accordingly.

## 2020-02-03 NOTE — Progress Notes (Signed)
Report received from [place], [place]. Patient arrived to unit from PACU. Patient A&Ox4. Wire cutters at patients bedside within reach. Suction set up at bedside. Patient complain of no pain. Call bell within reach.

## 2020-02-03 NOTE — Discharge Instructions (Signed)
Rinse mouth after eating or drinking anything.  Stay on a full liquid diet.  Try hard to take him as much protein and as many calories as you can.  Brush teeth as often as you can.  If you feel the need to vomit, you may cut the 3 wires there is 1 on the right one on the left and one in the front and remove them and then you may open your mouth.  Contact our office so that we can set you up for replacement.

## 2020-02-09 NOTE — Anesthesia Postprocedure Evaluation (Signed)
Anesthesia Post Note  Patient: David Ball  Procedure(s) Performed: MMS FIXATION WITH HYBIRD ARCH BARS (N/A )     Patient location during evaluation: PACU Anesthesia Type: General Level of consciousness: awake and alert Pain management: pain level controlled Vital Signs Assessment: post-procedure vital signs reviewed and stable Respiratory status: spontaneous breathing, nonlabored ventilation, respiratory function stable and patient connected to nasal cannula oxygen Cardiovascular status: blood pressure returned to baseline and stable Postop Assessment: no apparent nausea or vomiting Anesthetic complications: no   No complications documented.  Last Vitals:  Vitals:   02/03/20 0540 02/03/20 1005  BP: 116/76 117/81  Pulse: 64 71  Resp: 18 18  Temp: 36.9 C 36.6 C  SpO2: 99% 100%    Last Pain:  Vitals:   02/03/20 1005  TempSrc: Axillary  PainSc:                  Tinsley Lomas

## 2020-03-28 ENCOUNTER — Encounter (HOSPITAL_BASED_OUTPATIENT_CLINIC_OR_DEPARTMENT_OTHER): Payer: Self-pay | Admitting: Otolaryngology

## 2020-03-28 ENCOUNTER — Other Ambulatory Visit: Payer: Self-pay

## 2020-03-28 ENCOUNTER — Encounter (HOSPITAL_COMMUNITY): Payer: Self-pay | Admitting: Otolaryngology

## 2020-03-29 ENCOUNTER — Other Ambulatory Visit (HOSPITAL_COMMUNITY)
Admission: RE | Admit: 2020-03-29 | Discharge: 2020-03-29 | Disposition: A | Discharge status: Home / Self Care | Payer: HRSA Program | Source: Ambulatory Visit | Attending: Otolaryngology | Admitting: Otolaryngology

## 2020-03-29 DIAGNOSIS — Z01812 Encounter for preprocedural laboratory examination: Secondary | ICD-10-CM | POA: Diagnosis present

## 2020-03-29 DIAGNOSIS — Z20822 Contact with and (suspected) exposure to covid-19: Secondary | ICD-10-CM | POA: Insufficient documentation

## 2020-03-29 LAB — SARS CORONAVIRUS 2 (TAT 6-24 HRS): SARS Coronavirus 2: NEGATIVE

## 2020-03-30 NOTE — H&P (Signed)
David Ball is an 19 y.o. male.   Chief Complaint: Mandible fracture HPI: Mandible fracture treated with MMF, here for removal of MMF.  History reviewed. No pertinent past medical history.  Past Surgical History:  Procedure Laterality Date  . ORIF MANDIBULAR FRACTURE N/A 02/02/2020   Procedure: MMS FIXATION WITH HYBIRD ARCH BARS;  Surgeon: Serena Colonel, MD;  Location: [facility] OR;  Service: ENT;  Laterality: N/A;    History reviewed. No pertinent family history. Social History:  reports that he has never smoked. He has never used smokeless tobacco. He reports that he does not drink alcohol and does not use drugs.  Allergies: No Known Allergies  No medications prior to admission.    Results for orders placed or performed during the hospital encounter of 03/29/20 (from the past 48 hour(s))  SARS CORONAVIRUS 2 (TAT 6-24 HRS) Nasopharyngeal Nasopharyngeal Swab     Status: None   Collection Time: 03/29/20  2:37 PM   Specimen: Nasopharyngeal Swab  Result Value Ref Range   SARS Coronavirus 2 NEGATIVE NEGATIVE    Comment: (NOTE) SARS-CoV-2 target nucleic acids are NOT DETECTED.  The SARS-CoV-2 RNA is generally detectable in upper and lower respiratory specimens during the acute phase of infection. Negative results do not preclude SARS-CoV-2 infection, do not rule out co-infections with other pathogens, and should not be used as the sole basis for treatment or other patient management decisions. Negative results must be combined with clinical observations, patient history, and epidemiological information. The expected result is Negative.  Fact Sheet for Patients: HairSlick.no  Fact Sheet for Healthcare Providers: quierodirigir.com  This test is not yet approved or cleared by the [place] FDA and  has been authorized for detection and/or diagnosis of SARS-CoV-2 by FDA under an Emergency Use Authorization (EUA). This  EUA will remain  in effect (meaning this test can be used) for the duration of the COVID-19 declaration under Se ction 564(b)(1) of the Act, 21 U.S.C. section 360bbb-3(b)(1), unless the authorization is terminated or revoked sooner.  Performed at [facility], [address], [place], [place] [address]    No results found.  ROS: otherwise negative  There were no vitals taken for this visit.  PHYSICAL EXAM: Overall appearance:  Healthy appearing, in no distress Head:  Normocephalic, atraumatic. Ears: External ears look healthy. Nose: External nose is healthy in appearance. Internal nasal exam free of any lesions or obstruction. Oral Cavity/pharynx: Arch bars in place. Hypopharynx/Larynx: no signs of any mucosal lesions or masses identified. Vocal cords move normally. Neuro:  No identifiable neurologic deficits. Neck: No palpable neck masses.  Studies Reviewed: none    Assessment/Plan Remove MMF.  Serena Colonel 03/30/2020, 10:49 AM

## 2020-04-01 ENCOUNTER — Encounter (HOSPITAL_BASED_OUTPATIENT_CLINIC_OR_DEPARTMENT_OTHER): Payer: Self-pay | Admitting: Otolaryngology

## 2020-04-01 ENCOUNTER — Encounter (HOSPITAL_BASED_OUTPATIENT_CLINIC_OR_DEPARTMENT_OTHER)
Admission: RE | Disposition: A | Discharge status: Home / Self Care | Payer: Self-pay | Source: Home / Self Care | Attending: Otolaryngology

## 2020-04-01 ENCOUNTER — Ambulatory Visit (HOSPITAL_BASED_OUTPATIENT_CLINIC_OR_DEPARTMENT_OTHER): Payer: Medicaid Other | Admitting: Certified Registered"

## 2020-04-01 ENCOUNTER — Ambulatory Visit (HOSPITAL_BASED_OUTPATIENT_CLINIC_OR_DEPARTMENT_OTHER)
Admission: RE | Admit: 2020-04-01 | Discharge: 2020-04-01 | Disposition: A | Discharge status: Home / Self Care | Payer: Medicaid Other | Attending: Otolaryngology | Admitting: Otolaryngology

## 2020-04-01 ENCOUNTER — Other Ambulatory Visit: Payer: Self-pay

## 2020-04-01 DIAGNOSIS — Z472 Encounter for removal of internal fixation device: Secondary | ICD-10-CM | POA: Insufficient documentation

## 2020-04-01 HISTORY — PX: MANDIBULAR HARDWARE REMOVAL: SHX5205

## 2020-04-01 SURGERY — REMOVAL, HARDWARE, MANDIBLE
Anesthesia: General | Site: Mouth | Laterality: Bilateral

## 2020-04-01 MED ORDER — BACITRACIN ZINC 500 UNIT/GM EX OINT
TOPICAL_OINTMENT | CUTANEOUS | Status: AC
  Filled 2020-04-01: qty 0.9

## 2020-04-01 MED ORDER — LIDOCAINE 2% (20 MG/ML) 5 ML SYRINGE
INTRAMUSCULAR | Status: AC
  Filled 2020-04-01: qty 5

## 2020-04-01 MED ORDER — OXYCODONE HCL 5 MG/5ML PO SOLN: 5.0000 mg | Freq: Once | ORAL | Status: DC | PRN

## 2020-04-01 MED ORDER — LIDOCAINE-EPINEPHRINE 1 %-1:100000 IJ SOLN
INTRAMUSCULAR | Status: DC | PRN
  Administered 2020-04-01: 5 mL

## 2020-04-01 MED ORDER — ONDANSETRON HCL 4 MG/2ML IJ SOLN
INTRAMUSCULAR | Status: AC
  Filled 2020-04-01: qty 2

## 2020-04-01 MED ORDER — DEXMEDETOMIDINE (PRECEDEX) IN NS 20 MCG/5ML (4 MCG/ML) IV SYRINGE
PREFILLED_SYRINGE | INTRAVENOUS | Status: DC | PRN
  Administered 2020-04-01 (×2): 4 ug via INTRAVENOUS

## 2020-04-01 MED ORDER — MIDAZOLAM HCL 2 MG/2ML IJ SOLN
INTRAMUSCULAR | Status: AC
  Filled 2020-04-01: qty 2

## 2020-04-01 MED ORDER — FENTANYL CITRATE (PF) 100 MCG/2ML IJ SOLN
INTRAMUSCULAR | Status: DC | PRN
Start: 2020-04-01 — End: 2020-04-01
  Administered 2020-04-01 (×2): 25 ug via INTRAVENOUS

## 2020-04-01 MED ORDER — HYDROMORPHONE HCL 1 MG/ML IJ SOLN
INTRAMUSCULAR | Status: AC
  Filled 2020-04-01: qty 0.5

## 2020-04-01 MED ORDER — LACTATED RINGERS IV SOLN: INTRAVENOUS | Status: DC

## 2020-04-01 MED ORDER — PROPOFOL 500 MG/50ML IV EMUL
INTRAVENOUS | Status: AC
  Filled 2020-04-01: qty 50

## 2020-04-01 MED ORDER — OXYCODONE HCL 5 MG PO TABS: 5.0000 mg | ORAL_TABLET | Freq: Once | ORAL | Status: DC | PRN

## 2020-04-01 MED ORDER — PROMETHAZINE HCL 25 MG/ML IJ SOLN: 6.2500 mg | INTRAMUSCULAR | Status: DC | PRN

## 2020-04-01 MED ORDER — LIDOCAINE-EPINEPHRINE 1 %-1:100000 IJ SOLN
INTRAMUSCULAR | Status: AC
  Filled 2020-04-01: qty 1

## 2020-04-01 MED ORDER — ONDANSETRON HCL 4 MG/2ML IJ SOLN
INTRAMUSCULAR | Status: DC | PRN
  Administered 2020-04-01: 4 mg via INTRAVENOUS

## 2020-04-01 MED ORDER — BACITRACIN 500 UNIT/GM EX OINT
TOPICAL_OINTMENT | CUTANEOUS | Status: DC | PRN
  Administered 2020-04-01: 1 via TOPICAL

## 2020-04-01 MED ORDER — OXYMETAZOLINE HCL 0.05 % NA SOLN
NASAL | Status: AC
  Filled 2020-04-01: qty 30

## 2020-04-01 MED ORDER — PROPOFOL 500 MG/50ML IV EMUL
INTRAVENOUS | Status: DC | PRN
  Administered 2020-04-01: 20 ug/kg/min via INTRAVENOUS

## 2020-04-01 MED ORDER — MIDAZOLAM HCL 5 MG/5ML IJ SOLN
INTRAMUSCULAR | Status: DC | PRN
  Administered 2020-04-01 (×2): 1 mg via INTRAVENOUS

## 2020-04-01 MED ORDER — FENTANYL CITRATE (PF) 100 MCG/2ML IJ SOLN
INTRAMUSCULAR | Status: AC
  Filled 2020-04-01: qty 2

## 2020-04-01 MED ORDER — HYDROMORPHONE HCL 1 MG/ML IJ SOLN
0.2500 mg | INTRAMUSCULAR | Status: DC | PRN
  Administered 2020-04-01: 0.5 mg via INTRAVENOUS

## 2020-04-01 MED ORDER — MEPERIDINE HCL 25 MG/ML IJ SOLN: 6.2500 mg | INTRAMUSCULAR | Status: DC | PRN

## 2020-04-01 SURGICAL SUPPLY — 29 items
BLADE SURG 15 STRL LF DISP TIS (BLADE) ×1 IMPLANT
BLADE SURG 15 STRL SS (BLADE) ×3
CANISTER SUCT 1200ML W/VALVE (MISCELLANEOUS) ×3 IMPLANT
COVER MAYO STAND STRL (DRAPES) ×3 IMPLANT
COVER WAND RF STERILE (DRAPES) IMPLANT
DECANTER SPIKE VIAL GLASS SM (MISCELLANEOUS) ×3 IMPLANT
ELECT COATED BLADE 2.86 ST (ELECTRODE) IMPLANT
ELECT REM PT RETURN 9FT ADLT (ELECTROSURGICAL)
ELECTRODE REM PT RTRN 9FT ADLT (ELECTROSURGICAL) IMPLANT
GAUZE 4X4 16PLY RFD (DISPOSABLE) IMPLANT
GLOVE ECLIPSE 7.5 STRL STRAW (GLOVE) ×3 IMPLANT
GOWN STRL REUS W/ TWL LRG LVL3 (GOWN DISPOSABLE) ×1 IMPLANT
GOWN STRL REUS W/TWL LRG LVL3 (GOWN DISPOSABLE) ×3
MARKER SKIN DUAL TIP RULER LAB (MISCELLANEOUS) IMPLANT
NDL PRECISIONGLIDE 27X1.5 (NEEDLE) ×1 IMPLANT
NEEDLE PRECISIONGLIDE 27X1.5 (NEEDLE) ×3 IMPLANT
NS IRRIG 1000ML POUR BTL (IV SOLUTION) IMPLANT
PACK BASIN DAY SURGERY FS (CUSTOM PROCEDURE TRAY) ×3 IMPLANT
PENCIL FOOT CONTROL (ELECTRODE) IMPLANT
SCISSORS WIRE ANG 4 3/4 DISP (INSTRUMENTS) IMPLANT
SHEET MEDIUM DRAPE 40X70 STRL (DRAPES) ×3 IMPLANT
SUT CHROMIC 3 0 PS 2 (SUTURE) IMPLANT
SUT CHROMIC 4 0 PS 2 18 (SUTURE) IMPLANT
SYR CONTROL 10ML LL (SYRINGE) ×3 IMPLANT
TOWEL GREEN STERILE FF (TOWEL DISPOSABLE) ×6 IMPLANT
TRAY DSU PREP LF (CUSTOM PROCEDURE TRAY) IMPLANT
TUBE CONNECTING 20'X1/4 (TUBING) ×1
TUBE CONNECTING 20X1/4 (TUBING) ×2 IMPLANT
YANKAUER SUCT BULB TIP NO VENT (SUCTIONS) ×3 IMPLANT

## 2020-04-01 NOTE — Anesthesia Postprocedure Evaluation (Signed)
Anesthesia Post Note  Patient: David Ball  Procedure(s) Performed: MANDIBULAR HARDWARE REMOVAL (Bilateral Mouth)     Patient location during evaluation: PACU Anesthesia Type: General Level of consciousness: awake and alert Pain management: pain level controlled Vital Signs Assessment: post-procedure vital signs reviewed and stable Respiratory status: spontaneous breathing, nonlabored ventilation and respiratory function stable Cardiovascular status: blood pressure returned to baseline and stable Postop Assessment: no apparent nausea or vomiting Anesthetic complications: no   No complications documented.  Last Vitals:  Vitals:   04/01/20 0815 04/01/20 0824  BP:  133/86  Pulse: 79 72  Resp: 12 16  Temp:  36.9 C  SpO2: 99% 100%    Last Pain:  Vitals:   04/01/20 0815  TempSrc:   PainSc: 4                  Lowella Curb

## 2020-04-01 NOTE — Interval H&P Note (Signed)
History and Physical Interval Note:  04/01/2020 7:19 AM  David Ball  has presented today for surgery, with the diagnosis of Hardware removal from jaw fracture.  The various methods of treatment have been discussed with the patient and family. After consideration of risks, benefits and other options for treatment, the patient has consented to  Procedure(s): MANDIBULAR HARDWARE REMOVAL (Bilateral) as a surgical intervention.  The patient's history has been reviewed, patient examined, no change in status, stable for surgery.  I have reviewed the patient's chart and labs.  Questions were answered to the patient's satisfaction.     Serena Colonel

## 2020-04-01 NOTE — Transfer of Care (Signed)
Immediate Anesthesia Transfer of Care Note  Patient: David Ball  Procedure(s) Performed: MANDIBULAR HARDWARE REMOVAL (Bilateral Mouth)  Patient Location: PACU  Anesthesia Type:General  Level of Consciousness: awake, alert  and oriented  Airway & Oxygen Therapy: Patient Spontanous Breathing and Patient connected to nasal cannula oxygen  Post-op Assessment: Report given to RN and Post -op Vital signs reviewed and stable  Post vital signs: Reviewed and stable  Last Vitals:  Vitals Value Taken Time  BP    Temp    Pulse 88 04/01/20 0753  Resp 15 04/01/20 0753  SpO2 100 % 04/01/20 0753  Vitals shown include unvalidated device data.  Last Pain:  Vitals:   04/01/20 0654  TempSrc: Axillary  PainSc: 0-No pain         Complications: No complications documented.

## 2020-04-01 NOTE — Anesthesia Preprocedure Evaluation (Addendum)
Anesthesia Evaluation  Patient identified by MRN, date of birth, ID band Patient awake    Reviewed: Allergy & Precautions, NPO status , Patient's Chart, lab work & pertinent test results  History of Anesthesia Complications Negative for: history of anesthetic complications  Airway Mallampati: IV  TM Distance: >3 FB Neck ROM: Full  Mouth opening: Limited Mouth Opening  Dental  (+) Dental Advisory Given,    Pulmonary    breath sounds clear to auscultation       Cardiovascular negative cardio ROS   Rhythm:Regular     Neuro/Psych negative neurological ROS  negative psych ROS   GI/Hepatic negative GI ROS, Neg liver ROS,   Endo/Other  negative endocrine ROS  Renal/GU negative Renal ROS     Musculoskeletal negative musculoskeletal ROS (+)   Abdominal   Peds  Hematology negative hematology ROS (+)   Anesthesia Other Findings   Reproductive/Obstetrics                            Anesthesia Physical  Anesthesia Plan  ASA: I  Anesthesia Plan: General   Post-op Pain Management:    Induction: Intravenous  PONV Risk Score and Plan: 2 and Treatment may vary due to age or medical condition, Ondansetron and Midazolam  Airway Management Planned: Mask  Additional Equipment: None  Intra-op Plan:   Post-operative Plan:   Informed Consent: I have reviewed the patients History and Physical, chart, labs and discussed the procedure including the risks, benefits and alternatives for the proposed anesthesia with the patient or authorized representative who has indicated his/her understanding and acceptance.     Dental advisory given  Plan Discussed with: CRNA and Surgeon  Anesthesia Plan Comments:        Anesthesia Quick Evaluation

## 2020-04-01 NOTE — Discharge Instructions (Signed)
Rinse mouth with salt water 3 times daily for the next 2 weeks.  Start off with soft foods and work your way up to solid foods as you can tolerate.  Brush your teeth as you normally would. Use Tylenol/Motrin as needed.    Post Anesthesia Home Care Instructions  Activity: Get plenty of rest for the remainder of the day. A responsible individual must stay with you for 24 hours following the procedure.  For the next 24 hours, DO NOT: -Drive a car -Advertising copywriter -Drink alcoholic beverages -Take any medication unless instructed by your physician -Make any legal decisions or sign important papers.  Meals: Start with liquid foods such as gelatin or soup. Progress to regular foods as tolerated. Avoid greasy, spicy, heavy foods. If nausea and/or vomiting occur, drink only clear liquids until the nausea and/or vomiting subsides. Call your physician if vomiting continues.  Special Instructions/Symptoms: Your throat may feel dry or sore from the anesthesia or the breathing tube placed in your throat during surgery. If this causes discomfort, gargle with warm salt water. The discomfort should disappear within 24 hours.  If you had a scopolamine patch placed behind your ear for the management of post- operative nausea and/or vomiting:  1. The medication in the patch is effective for 72 hours, after which it should be removed.  Wrap patch in a tissue and discard in the trash. Wash hands thoroughly with soap and water. 2. You may remove the patch earlier than 72 hours if you experience unpleasant side effects which may include dry mouth, dizziness or visual disturbances. 3. Avoid touching the patch. Wash your hands with soap and water after contact with the patch.    Call your surgeon if you experience:   1.  Fever over 101.0. 2.  Inability to urinate. 3.  Nausea and/or vomiting. 4.  Extreme swelling or bruising at the surgical site. 5.  Continued bleeding from the incision. 6.  Increased  pain, redness or drainage from the incision. 7.  Problems related to your pain medication. 8.  Any problems and/or concerns

## 2020-04-01 NOTE — Op Note (Signed)
OPERATIVE REPORT  DATE OF SURGERY: 04/01/2020  PATIENT:  David Ball,  19 y.o. male  PRE-OPERATIVE DIAGNOSIS:  Mandibular hardware  POST-OPERATIVE DIAGNOSIS:  Mandibular hardware  PROCEDURE:  Procedure(s): MANDIBULAR HARDWARE REMOVAL  SURGEON:  Susy Frizzle, MD  ASSISTANTS: none  ANESTHESIA:   General   EBL:  30 ml  DRAINS: none  LOCAL MEDICATIONS USED:  1% Xylocaine with epinephrine  SPECIMEN:  none  COUNTS:  Correct  PROCEDURE DETAILS: The patient was taken to the operating room and placed on the operating table in the supine position. The MMF wires were cut and removed. Following induction of intravenous sedation, local anesthetic was infiltrated along the upper and lower gingival mucosa. All of the screws were identified, uncovered by mucosal overgrowth and removed with screwdriver. The upper and lower arch bar were then completely removed. The oral cavity was irrigated with saline and suctioned. The patient was awakened from sedation and transferred to recovery in stable condition.    PATIENT DISPOSITION:  To PACU, stable

## 2020-04-04 ENCOUNTER — Encounter (HOSPITAL_BASED_OUTPATIENT_CLINIC_OR_DEPARTMENT_OTHER): Payer: Self-pay | Admitting: Otolaryngology

## 2022-06-16 IMAGING — CT CT HEAD W/O CM
4 series · 15 of 47 positions shown, 17 images · non-contrast
Comparison: None.

CLINICAL DATA: Recent fall with facial pain and headaches, initial
encounter

EXAM:
CT HEAD WITHOUT CONTRAST
CT MAXILLOFACIAL WITHOUT CONTRAST
TECHNIQUE: Multidetector CT imaging of the head and maxillofacial structures
were performed using the standard protocol without intravenous
contrast. Multiplanar CT image reconstructions of the maxillofacial
structures were also generated.

[Series 3: head wo · axial · 0.43mm/px · z∈[+1174,+1294]mm · 7 of 32 slices shown, 9 images]
[im 4/32  brain]
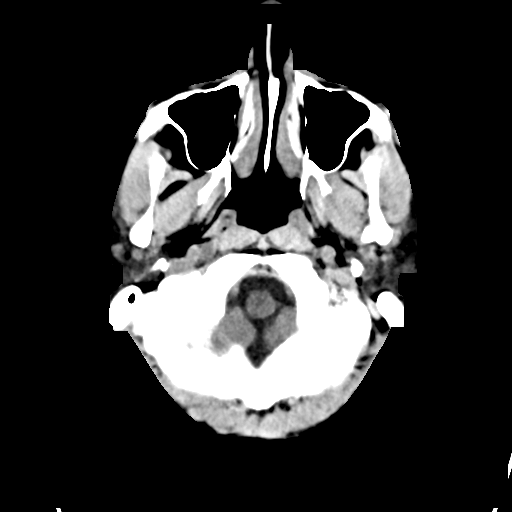
[im 4/32  bone]
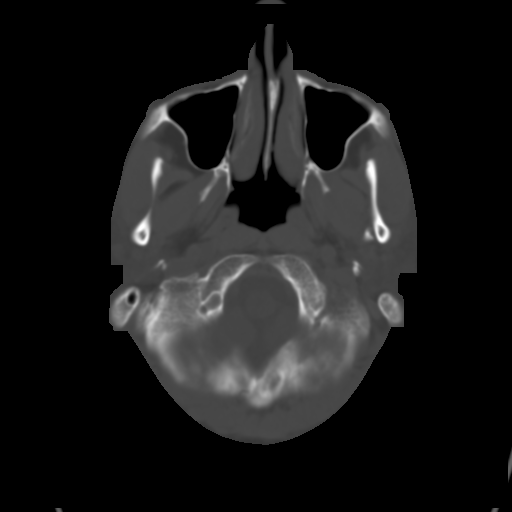
[im 8/32  brain]
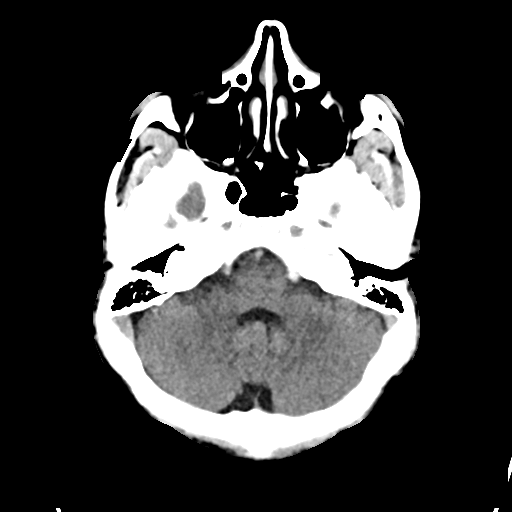
[im 12/32  brain]
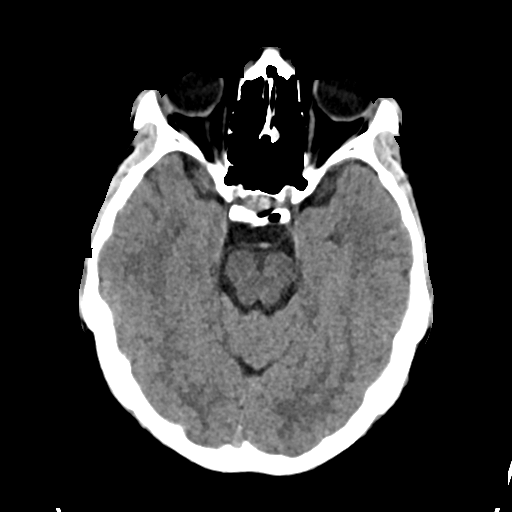
[im 16/32  brain]
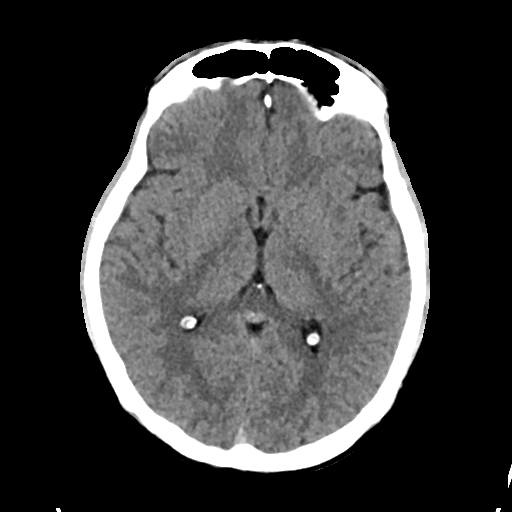
[im 20/32  brain]
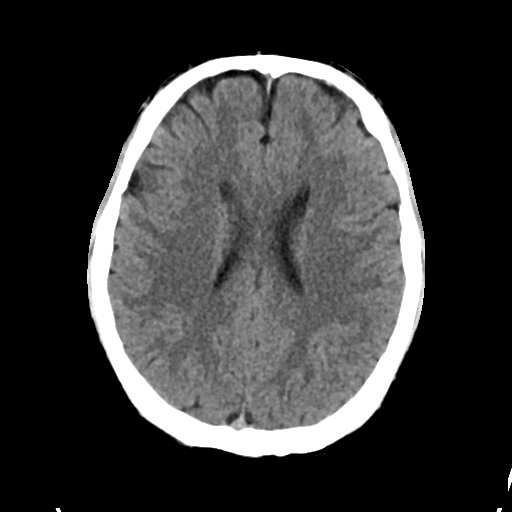
[im 20/32  bone]
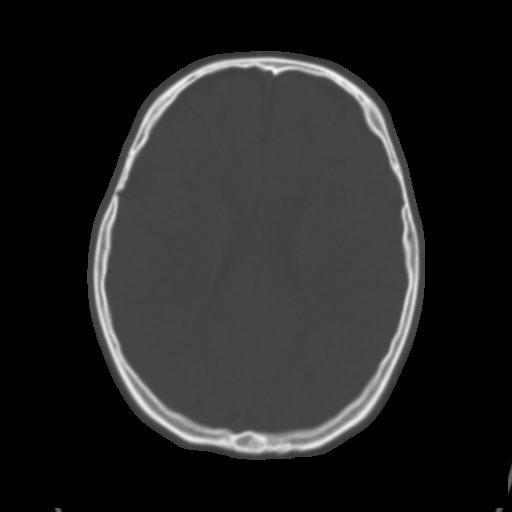
[im 24/32  brain]
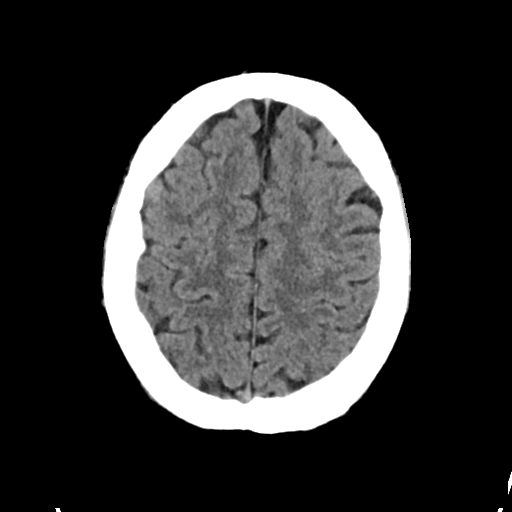
[im 28/32  brain]
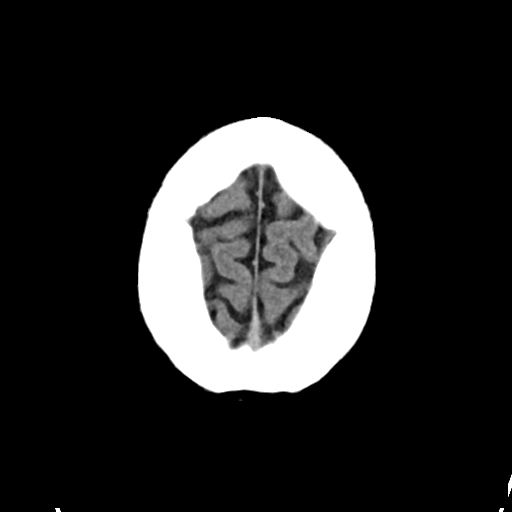

[Series 4: head bone · axial · 0.43mm/px · z∈[+1174,+1190]mm · 2 of 80 slices shown]
[im 8/80  bone]
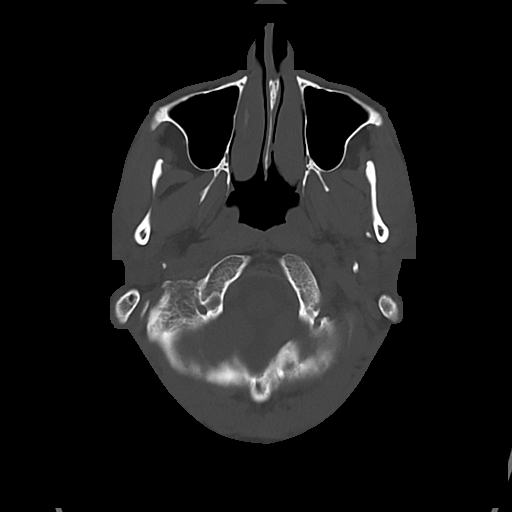
[im 16/80  bone]
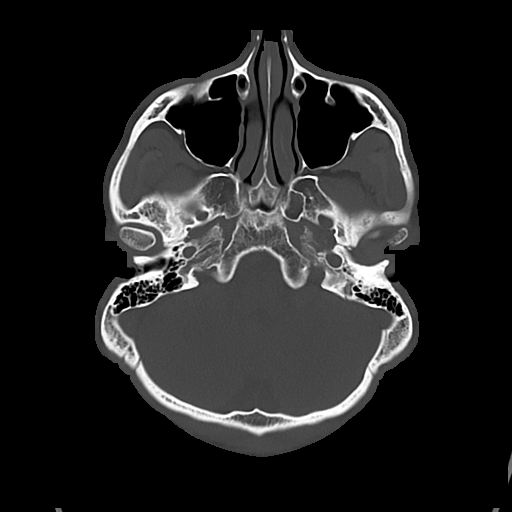

[Series 5: cor soft · coronal · 0.32mm/px · 3 of 69 slices shown]
[im 23/69  brain]
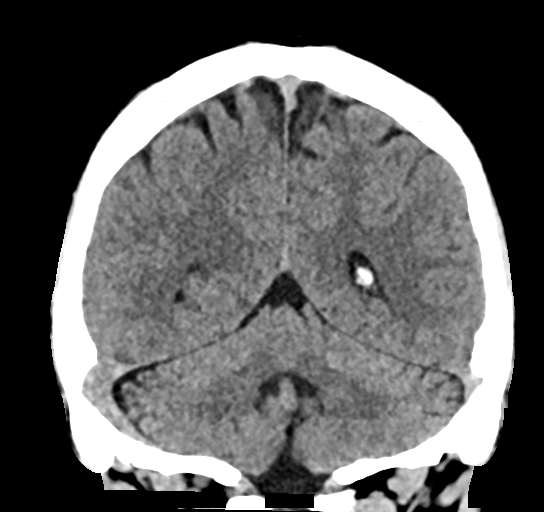
[im 31/69  brain]
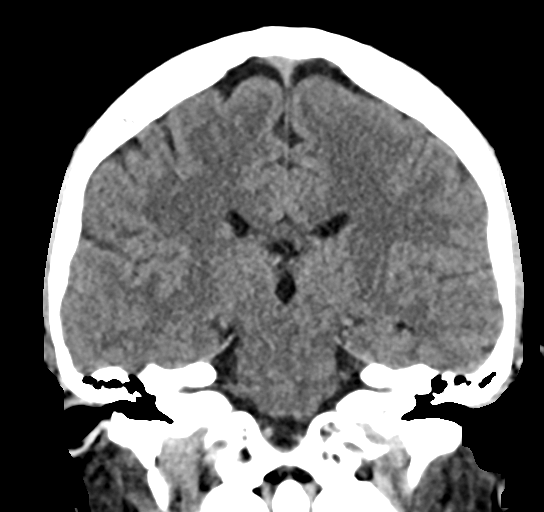
[im 38/69  brain]
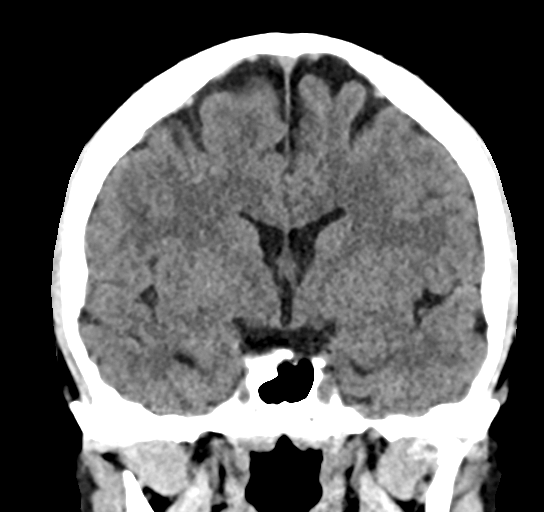

[Series 6: sag soft · sagittal · 0.32mm/px · 3 of 58 slices shown]
[im 20/58  brain]
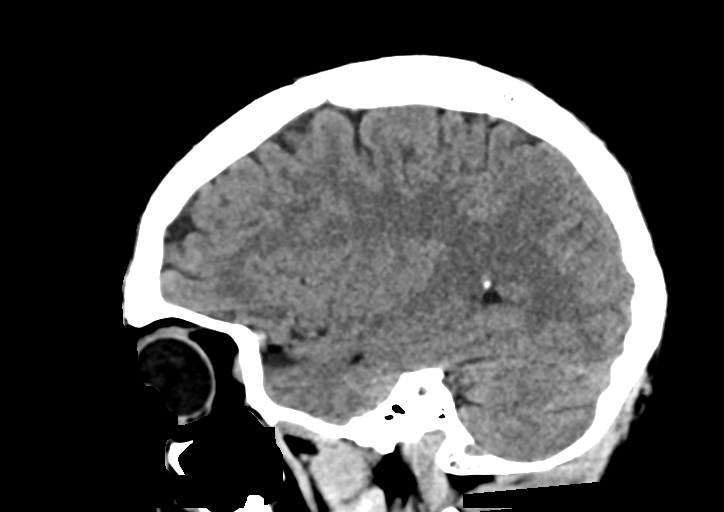
[im 29/58  brain]
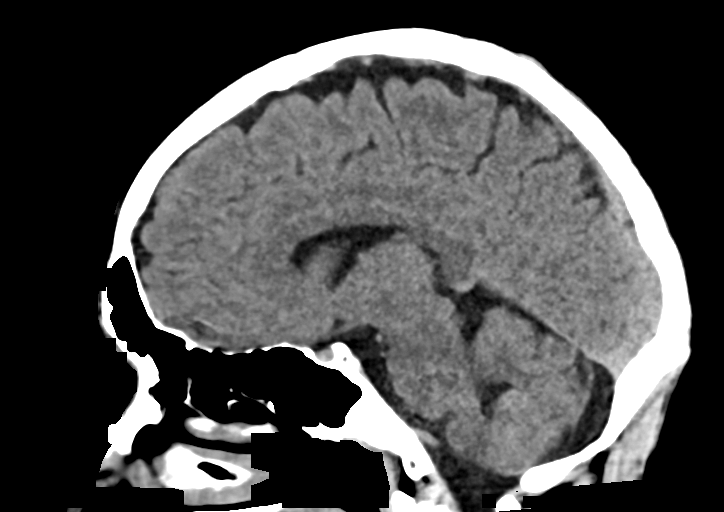
[im 39/58  brain]
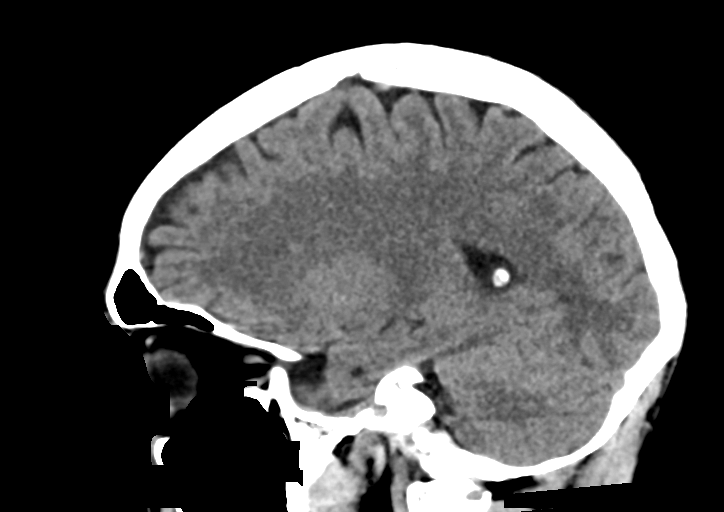

[15 of 47 positions shown; findings below may reference images not displayed]

FINDINGS: CT HEAD FINDINGS

Brain: No evidence of acute infarction, hemorrhage, hydrocephalus,
extra-axial collection or mass lesion/mass effect.

Vascular: No hyperdense vessel or unexpected calcification.

Skull: Normal. Negative for fracture or focal lesion.

Other: Note is made of a fracture through the left mandibular neck
extending towards the condyle.

CT MAXILLOFACIAL FINDINGS

Osseous: Multiple mandibular fractures are identified. A fracture
through the left mandibular neck extending towards the condyle with
mild impaction is noted. Additionally a midline fracture of the
mandible anteriorly is seen extending obliquely through the right
mandible. Only minimal displacement is noted in the more anterior
mandibular fracture. No other fractures are noted. Nasal bones are
intact.

Orbits: Orbits and their contents are within normal limits.

Sinuses: Paranasal sinuses well aerated. Mucosal changes are seen.
No air-fluid levels noted.

Soft tissues: Surrounding soft tissue structures are within normal
limits. No focal hematoma is seen.
IMPRESSION: CT of the head: No acute intracranial abnormality noted.

CT of the maxillofacial bones: Oblique fracture through the anterior
aspect of the mandible extending to the right as well as an oblique
fracture through the mandibular neck on the left with impaction.
